# Patient Record
Sex: Male | Born: 1985 | Race: Black or African American | Hispanic: No | Marital: Single | Smoking: Never smoker
Health system: Southern US, Community
[De-identification: ages and names within clinical notes are randomized; demographics above are authoritative.]

---
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
## 2012-06-25 ENCOUNTER — Emergency Department: Payer: Self-pay | Admitting: Emergency Medicine

## 2012-06-25 LAB — COMPREHENSIVE METABOLIC PANEL
Albumin: 4.2 g/dL (ref 3.4–5.0)
Alkaline Phosphatase: 74 U/L (ref 50–136)
BUN: 16 mg/dL (ref 7–18)
Bilirubin,Total: 1.4 mg/dL — ABNORMAL HIGH (ref 0.2–1.0)
Chloride: 102 mmol/L (ref 98–107)
Creatinine: 1.1 mg/dL (ref 0.60–1.30)
EGFR (African American): >60
EGFR (Non-African Amer.): >60
Osmolality: 277 (ref 275–301)
SGOT(AST): 17 U/L (ref 15–37)
SGPT (ALT): 19 U/L (ref 12–78)
Sodium: 138 mmol/L (ref 136–145)

## 2012-06-25 LAB — URINALYSIS, COMPLETE
Bilirubin,UR: NEGATIVE
Glucose,UR: NEGATIVE mg/dL (ref 0–75)
Ketone: NEGATIVE
Leukocyte Esterase: NEGATIVE
Protein: NEGATIVE
Specific Gravity: 1.025 (ref 1.003–1.030)
Squamous Epithelial: NONE SEEN

## 2012-06-25 LAB — CBC
HCT: 42.4 % (ref 40.0–52.0)
MCH: 31.5 pg (ref 26.0–34.0)
MCHC: 34.9 g/dL (ref 32.0–36.0)
MCV: 90 fL (ref 80–100)
Platelet: 221 10*3/uL (ref 150–440)
RBC: 4.7 10*6/uL (ref 4.40–5.90)
WBC: 6.2 10*3/uL (ref 3.8–10.6)

## 2012-06-25 LAB — LIPASE, BLOOD: Lipase: 143 U/L (ref 73–393)

## 2013-09-27 ENCOUNTER — Emergency Department: Payer: Self-pay | Admitting: Emergency Medicine

## 2013-09-27 LAB — URINALYSIS, COMPLETE
BACTERIA: NONE SEEN
Bilirubin,UR: NEGATIVE
Blood: NEGATIVE
Glucose,UR: NEGATIVE mg/dL (ref 0–75)
KETONE: NEGATIVE
Leukocyte Esterase: NEGATIVE
NITRITE: NEGATIVE
PROTEIN: NEGATIVE
Ph: 7 (ref 4.5–8.0)
SPECIFIC GRAVITY: 1.018 (ref 1.003–1.030)
Squamous Epithelial: NONE SEEN
WBC UR: <1 /HPF (ref 0–5)

## 2013-09-27 LAB — CBC WITH DIFFERENTIAL/PLATELET
BASOS PCT: 0.5 %
Basophil #: 0 10*3/uL (ref 0.0–0.1)
EOS ABS: 0.1 10*3/uL (ref 0.0–0.7)
EOS PCT: 1.3 %
HCT: 43.6 % (ref 40.0–52.0)
HGB: 14.4 g/dL (ref 13.0–18.0)
Lymphocyte #: 2.3 10*3/uL (ref 1.0–3.6)
Lymphocyte %: 29 %
MCH: 30.7 pg (ref 26.0–34.0)
MCHC: 33.1 g/dL (ref 32.0–36.0)
MCV: 93 fL (ref 80–100)
MONO ABS: 0.6 x10 3/mm (ref 0.2–1.0)
Monocyte %: 8.3 %
NEUTROS PCT: 60.9 %
Neutrophil #: 4.8 10*3/uL (ref 1.4–6.5)
PLATELETS: 241 10*3/uL (ref 150–440)
RBC: 4.69 10*6/uL (ref 4.40–5.90)
RDW: 11.9 % (ref 11.5–14.5)
WBC: 7.9 10*3/uL (ref 3.8–10.6)

## 2013-09-27 LAB — COMPREHENSIVE METABOLIC PANEL
ALBUMIN: 4.1 g/dL (ref 3.4–5.0)
ALK PHOS: 64 U/L
ANION GAP: 3 — AB (ref 7–16)
BILIRUBIN TOTAL: 1.7 mg/dL — AB (ref 0.2–1.0)
BUN: 9 mg/dL (ref 7–18)
CHLORIDE: 102 mmol/L (ref 98–107)
CO2: 34 mmol/L — AB (ref 21–32)
Calcium, Total: 9 mg/dL (ref 8.5–10.1)
Creatinine: 0.87 mg/dL (ref 0.60–1.30)
EGFR (Non-African Amer.): >60
Glucose: 98 mg/dL (ref 65–99)
Osmolality: 276 (ref 275–301)
POTASSIUM: 4.2 mmol/L (ref 3.5–5.1)
SGOT(AST): 26 U/L (ref 15–37)
SGPT (ALT): 20 U/L
SODIUM: 139 mmol/L (ref 136–145)
TOTAL PROTEIN: 7.9 g/dL (ref 6.4–8.2)

## 2013-09-27 LAB — LIPASE, BLOOD: Lipase: 83 U/L (ref 73–393)

## 2017-10-02 ENCOUNTER — Emergency Department
Admission: EM | Admit: 2017-10-02 | Discharge: 2017-10-02 | Disposition: A | Discharge status: Home / Self Care | Payer: Commercial Managed Care - PPO | Attending: Emergency Medicine | Admitting: Emergency Medicine

## 2017-10-02 DIAGNOSIS — F12980 Cannabis use, unspecified with anxiety disorder: Secondary | ICD-10-CM | POA: Diagnosis not present

## 2017-10-02 DIAGNOSIS — R42 Dizziness and giddiness: Secondary | ICD-10-CM | POA: Diagnosis not present

## 2017-10-02 DIAGNOSIS — F419 Anxiety disorder, unspecified: Secondary | ICD-10-CM | POA: Insufficient documentation

## 2017-10-02 DIAGNOSIS — F12929 Cannabis use, unspecified with intoxication, unspecified: Secondary | ICD-10-CM | POA: Insufficient documentation

## 2017-10-02 DIAGNOSIS — R002 Palpitations: Secondary | ICD-10-CM | POA: Diagnosis present

## 2017-10-02 DIAGNOSIS — R61 Generalized hyperhidrosis: Secondary | ICD-10-CM | POA: Diagnosis not present

## 2017-10-02 MED ORDER — LORAZEPAM 2 MG/ML IJ SOLN
1.0000 mg | Freq: Once | INTRAMUSCULAR | Status: AC
  Administered 2017-10-02: 1 mg via INTRAMUSCULAR
  Filled 2017-10-02: qty 1

## 2017-10-02 NOTE — ED Provider Notes (Signed)
[facility] Emergency Department Provider Note  Time seen: 5:06 AM  I have reviewed the triage vital signs and the nursing notes.   HISTORY  Chief Complaint Ingestion thc    HPI Phillip Johnston is a 32 y.o. male with no past medical history presents to the emergency department after he eating a marijuana brownie.  According to the patient he had a marijuana brownie earlier tonight, states proximally 1 to 2 hours after ingesting he began feeling his heart racing feeling very anxious, lightheaded and sweaty.  Patient states he has had this feeling before after using marijuana but states it went away fairly quickly however this time it is not going away so the patient became very concerned and came to the emergency department.  [name] also ingested a brownie is also a patient in the emergency department with similar symptoms.   No past medical history on file.  There are no active problems to display for this patient.   Prior to Admission medications   Not on File    Allergies not on file  No family history on file.  Social History Social History   Tobacco Use  . Smoking status: Not on file  Substance Use Topics  . Alcohol use: Not on file  . Drug use: Not on file    Review of Systems Constitutional: Negative for fever.  Positive for lightheadedness/dizziness Cardiovascular: Negative for chest pain.  Positive for palpitations, feels like his heart is racing. Respiratory: Negative for shortness of breath. Gastrointestinal: Negative for abdominal pain.  Positive for nausea.  Negative for vomiting Genitourinary: Negative for urinary compaints Musculoskeletal: Negative for musculoskeletal complaints Skin: Negative for skin complaints  Neurological: Negative for headache All other ROS negative  ____________________________________________   PHYSICAL EXAM:  Constitutional: Alert and oriented. Well appearing and in no distress. Eyes:  Normal exam ENT   Head: Normocephalic and atraumatic   Mouth/Throat: Mucous membranes are moist. Cardiovascular: Normal rate, regular rhythm around 100 bpm.  No obvious murmur. Respiratory: Normal respiratory effort without tachypnea nor retractions. Breath sounds are clear  Gastrointestinal: Soft and nontender. No distention.  Musculoskeletal: Nontender with normal range of motion in all extremities.  Neurologic:  Normal speech and language. No gross focal neurologic deficits  Skin:  Skin is warm, dry and intact.  Psychiatric: Mood and affect are normal.   ____________________________________________  INITIAL IMPRESSION / ASSESSMENT AND PLAN / ED COURSE  Pertinent labs & imaging results that were available during my care of the patient were reviewed by me and considered in my medical decision making (see chart for details).  Patient presents to the emergency department with symptoms consistent with anxiety after ingesting cannabinoid.  Differential would include anxiety attack, panic attack, cannabinoid induced anxiety.  Overall the patient appears well, no distress he does have tachycardia around 100 to 110 bpm, is mildly anxious appearing.  We will dose 1 mg of IM Ativan and continue to closely monitor.  Patient agreeable to plan of care.  Patient feeling somewhat better.  Feeling more tired at this point.  We will continue to monitor in the emergency department.  Patient care signed out to Dr. Darnelle Johnston.  ____________________________________________   FINAL CLINICAL IMPRESSION(S) / ED DIAGNOSES  Panic disorder Marijuana ingestion   Minna Antis, MD 10/02/17 [phone]

## 2017-10-02 NOTE — ED Provider Notes (Signed)
Patient is awake alert and oriented he is ready to go at this time.  His wife is also doing well and will be discharged.   Arnaldo Natal, MD 10/02/17 [phone]

## 2017-10-02 NOTE — ED Triage Notes (Signed)
Pt here after eating a pot browne with his wife this a.m.  Feels his hart racing

## 2017-10-02 NOTE — Discharge Instructions (Addendum)
Please be careful to marijuana that is going around nowadays is much more potent than the marijuana from 10 or 15 years ago.  There are many more side effects with it as well.

## 2017-10-26 ENCOUNTER — Other Ambulatory Visit: Payer: Self-pay

## 2017-10-26 ENCOUNTER — Emergency Department: Payer: Commercial Managed Care - PPO

## 2017-10-26 ENCOUNTER — Encounter: Payer: Self-pay | Admitting: *Deleted

## 2017-10-26 ENCOUNTER — Emergency Department
Admission: EM | Admit: 2017-10-26 | Discharge: 2017-10-26 | Disposition: A | Discharge status: Home / Self Care | Payer: Commercial Managed Care - PPO | Attending: Emergency Medicine | Admitting: Emergency Medicine

## 2017-10-26 DIAGNOSIS — R079 Chest pain, unspecified: Secondary | ICD-10-CM | POA: Insufficient documentation

## 2017-10-26 DIAGNOSIS — N50811 Right testicular pain: Secondary | ICD-10-CM | POA: Diagnosis present

## 2017-10-26 DIAGNOSIS — N451 Epididymitis: Secondary | ICD-10-CM | POA: Diagnosis not present

## 2017-10-26 DIAGNOSIS — R0789 Other chest pain: Secondary | ICD-10-CM

## 2017-10-26 LAB — CBC
HCT: 40.7 % (ref 39.0–52.0)
Hemoglobin: 13.6 g/dL (ref 13.0–17.0)
MCH: 30.6 pg (ref 26.0–34.0)
MCHC: 33.4 g/dL (ref 30.0–36.0)
MCV: 91.7 fL (ref 80.0–100.0)
NRBC: 0 % (ref 0.0–0.2)
PLATELETS: 314 10*3/uL (ref 150–400)
RBC: 4.44 MIL/uL (ref 4.22–5.81)
RDW: 11.1 % — ABNORMAL LOW (ref 11.5–15.5)
WBC: 16.2 10*3/uL — AB (ref 4.0–10.5)

## 2017-10-26 LAB — TROPONIN I

## 2017-10-26 LAB — BASIC METABOLIC PANEL
ANION GAP: 9 (ref 5–15)
BUN: 17 mg/dL (ref 6–20)
CALCIUM: 9.6 mg/dL (ref 8.9–10.3)
CO2: 28 mmol/L (ref 22–32)
CREATININE: 0.87 mg/dL (ref 0.61–1.24)
Chloride: 99 mmol/L (ref 98–111)
Glucose, Bld: 101 mg/dL — ABNORMAL HIGH (ref 70–99)
Potassium: 3.7 mmol/L (ref 3.5–5.1)
SODIUM: 136 mmol/L (ref 135–145)

## 2017-10-26 LAB — CHLAMYDIA/NGC RT PCR (ARMC ONLY)
CHLAMYDIA TR: NOT DETECTED
N GONORRHOEAE: NOT DETECTED

## 2017-10-26 MED ORDER — DOXYCYCLINE HYCLATE 100 MG PO TABS
100.0000 mg | ORAL_TABLET | Freq: Once | ORAL | Status: AC
  Administered 2017-10-26: 100 mg via ORAL
  Filled 2017-10-26: qty 1

## 2017-10-26 MED ORDER — KETOROLAC TROMETHAMINE 60 MG/2ML IM SOLN
60.0000 mg | Freq: Once | INTRAMUSCULAR | Status: AC
  Administered 2017-10-26: 60 mg via INTRAMUSCULAR
  Filled 2017-10-26: qty 2

## 2017-10-26 MED ORDER — TRAMADOL HCL 50 MG PO TABS: 50.0000 mg | ORAL_TABLET | Freq: Four times a day (QID) | ORAL | 0 refills | Status: DC | PRN

## 2017-10-26 MED ORDER — CEFTRIAXONE SODIUM 250 MG IJ SOLR
250.0000 mg | Freq: Once | INTRAMUSCULAR | Status: AC
  Administered 2017-10-26: 250 mg via INTRAMUSCULAR
  Filled 2017-10-26: qty 250

## 2017-10-26 MED ORDER — DOXYCYCLINE HYCLATE 100 MG PO TABS: 100.0000 mg | ORAL_TABLET | Freq: Two times a day (BID) | ORAL | 0 refills | Status: DC

## 2017-10-26 NOTE — ED Notes (Addendum)
Pt to the er for chest pain intermittent x 3 weeks and a knot on one of his testicles x 3 days. Pt then states the knot is above the testicle. 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 No acute distress at this time. Pt took motrin at 1800.

## 2017-10-26 NOTE — ED Notes (Signed)
Pt ambulatory to POV without difficulty. VSS. NAD. Discharge instructions, RX and follow up reviewed. All questions and concerns addressed.  

## 2017-10-26 NOTE — ED Provider Notes (Addendum)
[facility] Emergency Department Provider Note  Time seen: 9:54 PM  I have reviewed the triage vital signs and the nursing notes.   HISTORY  Chief Complaint Chest Pain    HPI Phillip Johnston is a 32 y.o. male with no significant past medical history presents to the emergency department with complaints of chest pain and right testicular pain.  According to the patient he has been experiencing intermittent chest pain over the past 3 to 4 weeks.  States it comes and goes occasionally with shortness of breath although denies any chest pain or shortness of breath currently.  Denies any nausea or diaphoresis at any point.  No leg pain or swelling.  Patient states the main reason he came to the emergency department today however is due to right testicular pain.  States over the past 3 days he has been expensing intermittent right testicular pain which she states has progressively worsened and then today since around 12 PM it has been more severe.  Currently rates his pain as a 6/10 in severity to the right testicle, much worse with walking or palpating the testicle.  Denies any penile discharge.  He is sexually active but does not believe he has a sexually transmitted disease.  Denies any dysuria.   History reviewed. No pertinent past medical history.  There are no active problems to display for this patient.   History reviewed. No pertinent surgical history.  Prior to Admission medications   Not on File    No Known Allergies  History reviewed. No pertinent family history.  Social History Social History   Tobacco Use  . Smoking status: Never Smoker  . Smokeless tobacco: Never Used  Substance Use Topics  . Alcohol use: Yes    Frequency: Never    Comment: weekends  . Drug use: Never    Review of Systems Constitutional: Negative for fever. Cardiovascular: Intermittent chest pain x1 month.  None currently. Respiratory: Negative for shortness of  breath. Gastrointestinal: Negative for abdominal pain Genitourinary: Negative for dysuria or hematuria.  Negative for penile discharge.  Positive for right testicular pain swelling. Musculoskeletal: Negative for musculoskeletal complaints Skin: Negative for skin complaints  Neurological: Negative for headache All other ROS negative  ____________________________________________   PHYSICAL EXAM:  VITAL SIGNS: ED Triage Vitals  Enc Vitals Group     BP 10/26/17 1950 123/70     Pulse Rate 10/26/17 1950 90     Resp 10/26/17 1950 16     Temp 10/26/17 1950 98 F (36.7 C)     Temp Source 10/26/17 1950 Oral     SpO2 10/26/17 1950 98 %     Weight 10/26/17 1951 141 lb (64 kg)     Height 10/26/17 1951 6\' 1"  (1.854 m)     Head Circumference --      Peak Flow --      Pain Score 10/26/17 1951 7     Pain Loc --      Pain Edu? --      Excl. in GC? --     Constitutional: Alert and oriented. Well appearing and in no distress. Eyes: Normal exam ENT   Head: Normocephalic and atraumatic.   Mouth/Throat: Mucous membranes are moist. Cardiovascular: Normal rate, regular rhythm. No murmur Respiratory: Normal respiratory effort without tachypnea nor retractions. Breath sounds are clear  Gastrointestinal: Soft and nontender. No distention.   Genitourinary: Normal penile exam.  No lesions noted.  Patient does have right testicular swelling with significant  tenderness to palpation especially over his epididymis.  Normal left testicle.  No signs of hernia or mass. Musculoskeletal: Nontender with normal range of motion in all extremities. No lower extremity tenderness or edema. Neurologic:  Normal speech and language. No gross focal neurologic deficits are appreciated. Skin:  Skin is warm, dry and intact.  Psychiatric: Mood and affect are normal.   ____________________________________________    RADIOLOGY  Ultrasound negative for torsion shows likely right-sided  epididymitis.  ____________________________________________   INITIAL IMPRESSION / ASSESSMENT AND PLAN / ED COURSE  Pertinent labs & imaging results that were available during my care of the patient were reviewed by me and considered in my medical decision making (see chart for details).  Patient presents emergency department for chest pain intermittent x1 month however he states the real reason he came today was due to right testicular pain intermittent x3 days although worse today since 12 PM.  Differential would include epididymitis, testicular torsion, varicocele or hydrocele.  As far as the chest pain this would include chest wall pain, ACS, pleurisy.  We will check labs including cardiac enzymes obtain a scrotal ultrasound and continue to closely monitor.  Patient's lab work is largely within normal limits besides a moderate leukocytosis this is likely pain induced given the patient's significant pain today due to right testicular pain.  Right testicle is tender especially over the epididymis highly suspect epididymitis.  We will send for an ultrasound to rule out testicular torsion and help evaluate for epididymitis.  We will treat with Rocephin and discharged with doxycycline twice daily for the next 10 days.  We will send a urine sample for GC/chlamydia.  Patient agreeable to plan of care.  EKG reviewed and interpreted by myself shows normal sinus rhythm 80 bpm with a narrow QRS, normal axis, normal intervals, nonspecific ST changes.  ____________________________________________   FINAL CLINICAL IMPRESSION(S) / ED DIAGNOSES  Epididymitis    Minna Antis, MD 10/26/17 2158    Minna Antis, MD 11/07/17 [phone]

## 2017-10-26 NOTE — ED Triage Notes (Signed)
Pt reporting centralized chest pain x 3 days. No radiation. No cough or congestion reported. Nothing changes the pain per pt. No fevers and no respiratory distress.

## 2020-02-28 IMAGING — CR DG CHEST 2V
2 series · 2 of 2 positions shown · non-contrast
Comparison: None.

CLINICAL DATA: Chest pain

EXAM:
CHEST - 2 VIEW

[chest pa]
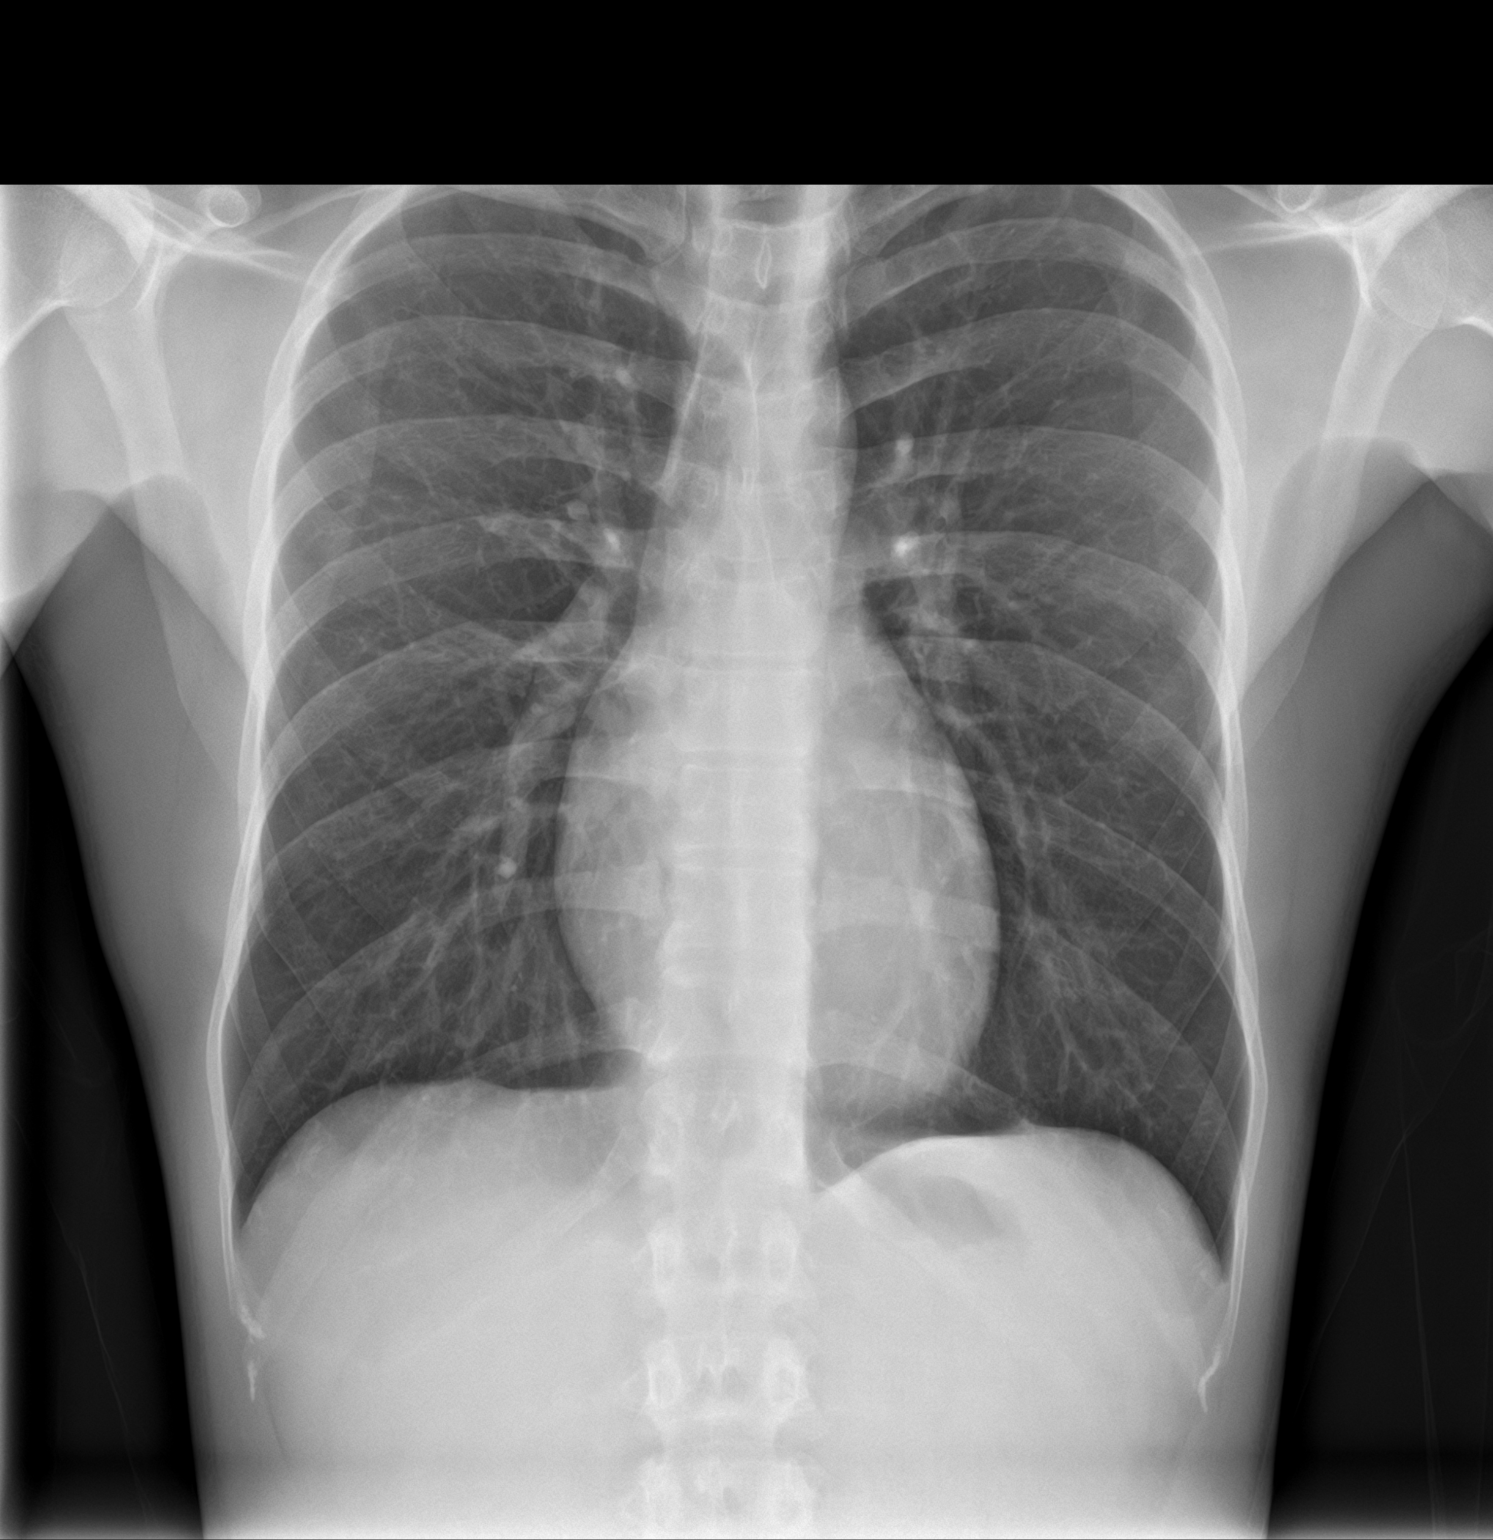

[chest lat]
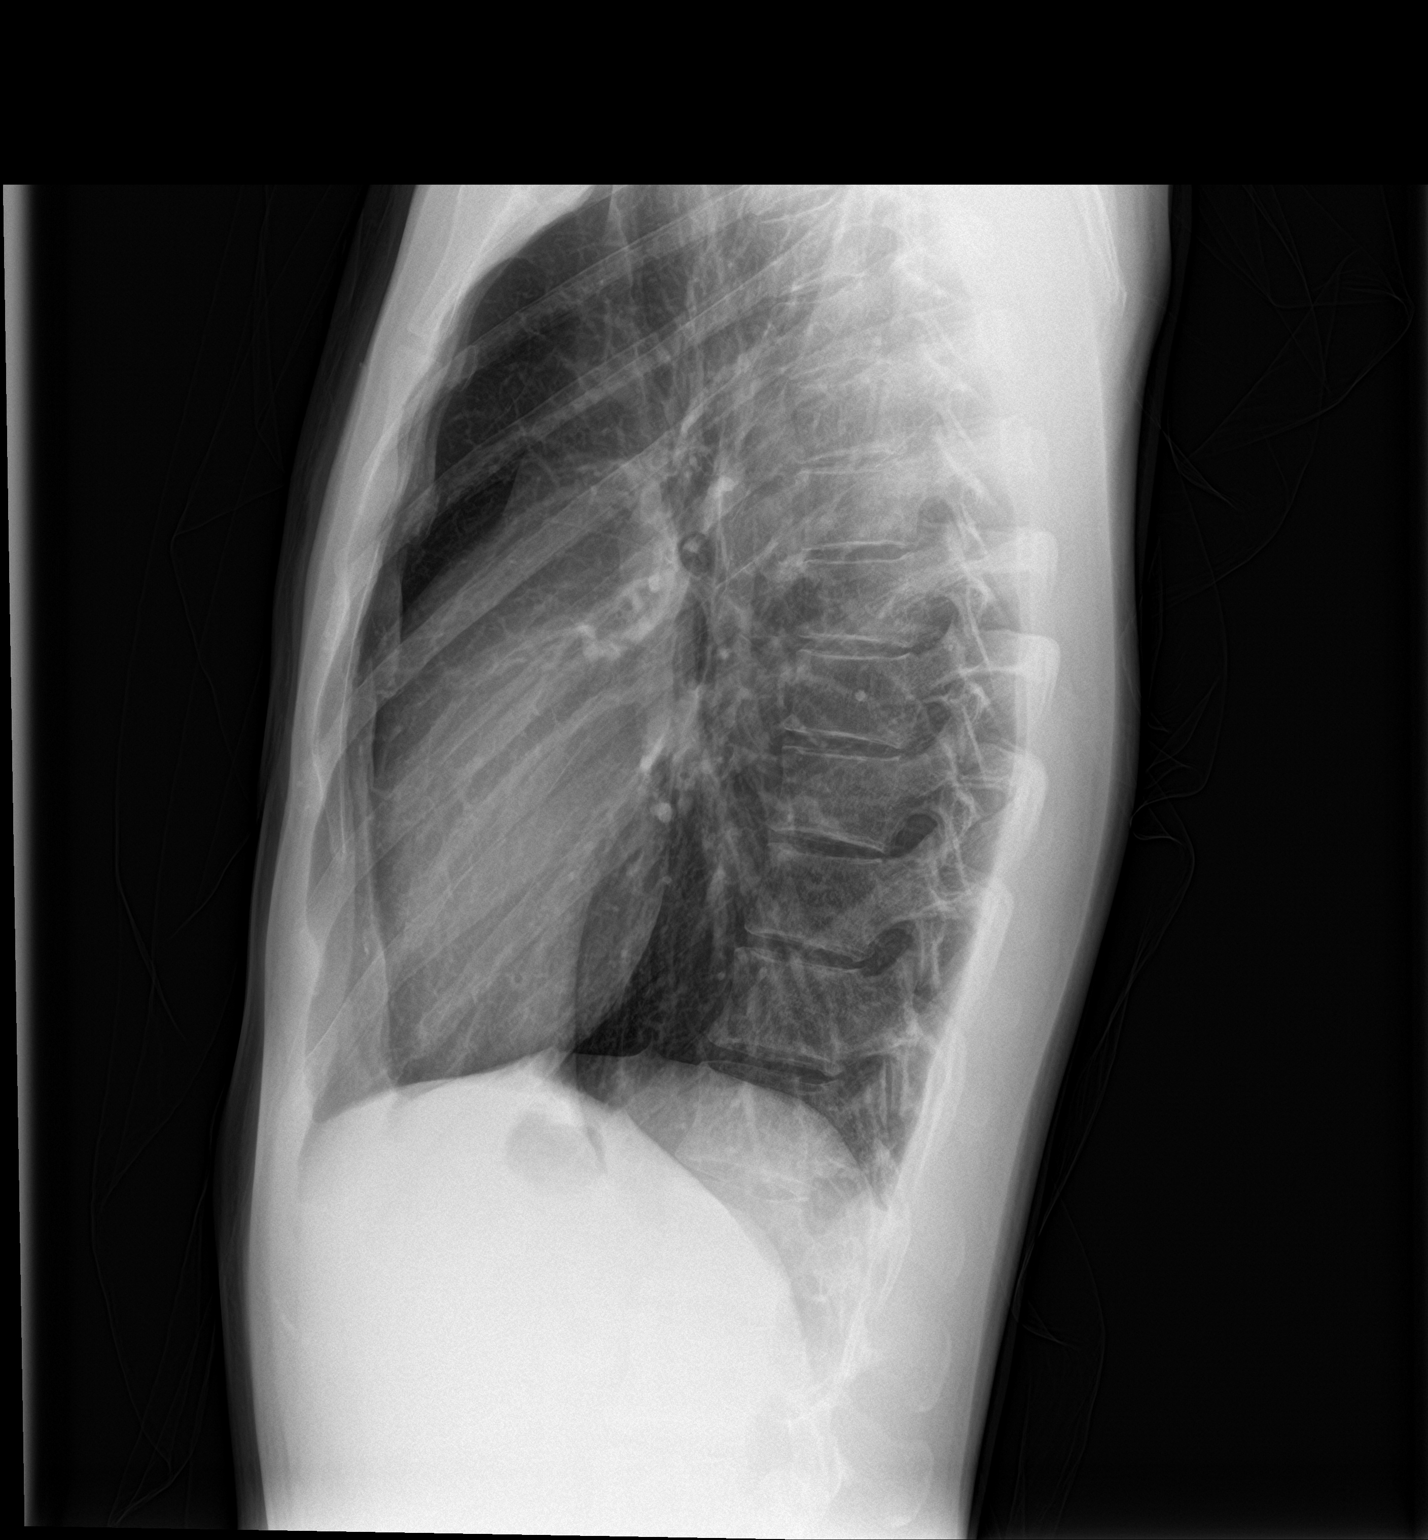

[2 of 2 positions shown; findings below may reference images not displayed]

FINDINGS: The heart size and mediastinal contours are within normal limits.
Both lungs are clear. The visualized skeletal structures are
unremarkable.
IMPRESSION: No active cardiopulmonary disease.

## 2020-11-25 ENCOUNTER — Ambulatory Visit: Payer: Commercial Managed Care - PPO | Admitting: Internal Medicine

## 2020-11-25 ENCOUNTER — Encounter: Payer: Self-pay | Admitting: Internal Medicine

## 2020-11-25 ENCOUNTER — Other Ambulatory Visit: Payer: Self-pay

## 2020-11-25 VITALS — BP 137/79 | HR 75 | Ht 73.0 in | Wt 143.6 lb

## 2020-11-25 DIAGNOSIS — R42 Dizziness and giddiness: Secondary | ICD-10-CM

## 2020-11-25 DIAGNOSIS — K219 Gastro-esophageal reflux disease without esophagitis: Secondary | ICD-10-CM | POA: Diagnosis not present

## 2020-11-25 DIAGNOSIS — Z0000 Encounter for general adult medical examination without abnormal findings: Secondary | ICD-10-CM

## 2020-11-25 MED ORDER — OMEPRAZOLE 20 MG PO CPDR: 20.0000 mg | DELAYED_RELEASE_CAPSULE | Freq: Every day | ORAL | 3 refills | Status: DC

## 2020-11-25 NOTE — Assessment & Plan Note (Signed)
Avoid coffee tea Sprite and Cokes

## 2020-11-25 NOTE — Progress Notes (Signed)
Established Patient Office Visit  Subjective:  Patient ID: Phillip Johnston, male    DOB: [birth year]  Age: 35 y.o. MRN: [id number]  CC:  Chief Complaint  Patient presents with   New Patient (Initial Visit)   Gastroesophageal Reflux    Patient having a lot of issues after eating. Feels pain in upper chest that goes into his throat after eating. Especially with acidic foods. Has feelings of bloating and feeling of needing to belch. He denies nausea but state that his stomach feels like it turns over like after riding roller coaster.     Gastroesophageal Reflux He complains of belching, chest pain, heartburn, a hoarse voice and water brash. He reports no coughing, no dysphagia, no nausea or no wheezing. The current episode started more than 1 month ago. The heartburn does not wake him from sleep. The heartburn does not limit his activity. Pertinent negatives include no anemia, fatigue, melena or orthopnea. He has tried an antacid for the symptoms.   Phillip Johnston presents for new patient visit  History reviewed. No pertinent past medical history.  History reviewed. No pertinent surgical history.  Family History  Problem Relation Age of Onset   Diabetes Father    Drug abuse Father    Alcohol abuse Father    Cancer Brother     Social History   Socioeconomic History   Marital status: Single    Spouse name: Not on file   Number of children: 0   Years of education: Not on file   Highest education level: 12th grade  Occupational History    Employer: [employer]  Tobacco Use   Smoking status: Never   Smokeless tobacco: Never  Substance and Sexual Activity   Alcohol use: Yes    Alcohol/week: 3.0 standard drinks    Types: 3 Standard drinks or equivalent per week    Comment: weekends   Drug use: Never   Sexual activity: Yes  Other Topics Concern   Not on file  Social History Narrative   Not on file   Social Determinants of Health   Financial Resource Strain: Not on file   Food Insecurity: Not on file  Transportation Needs: Not on file  Physical Activity: Not on file  Stress: Not on file  Social Connections: Not on file  Intimate Partner Violence: Not on file     Current Outpatient Medications:    doxycycline (VIBRA-TABS) 100 MG tablet, Take 1 tablet (100 mg total) by mouth 2 (two) times daily., Disp: 20 tablet, Rfl: 0   traMADol (ULTRAM) 50 MG tablet, Take 1 tablet (50 mg total) by mouth every 6 (six) hours as needed., Disp: 10 tablet, Rfl: 0   No Known Allergies  ROS Review of Systems  Constitutional: Negative.  Negative for fatigue.  HENT:  Positive for hoarse voice.   Eyes: Negative.   Respiratory:  Negative for cough and wheezing.   Cardiovascular:  Positive for chest pain.  Gastrointestinal:  Positive for heartburn. Negative for dysphagia, melena and nausea.  Endocrine: Negative.   Genitourinary: Negative.   Musculoskeletal: Negative.   Skin: Negative.   Allergic/Immunologic: Negative.   Neurological: Negative.   Hematological: Negative.   Psychiatric/Behavioral: Negative.    All other systems reviewed and are negative.    Objective:    Physical Exam Vitals reviewed.  Constitutional:      Appearance: Normal appearance.  HENT:     Mouth/Throat:     Mouth: Mucous membranes are moist.  Eyes:  Pupils: Pupils are equal, round, and reactive to light.  Neck:     Vascular: No carotid bruit.  Cardiovascular:     Rate and Rhythm: Normal rate and regular rhythm.     Pulses: Normal pulses.     Heart sounds: Normal heart sounds.  Pulmonary:     Effort: Pulmonary effort is normal.     Breath sounds: Normal breath sounds.  Abdominal:     General: Bowel sounds are normal.     Palpations: Abdomen is soft. There is no hepatomegaly, splenomegaly or mass.     Tenderness: There is no abdominal tenderness.     Hernia: No hernia is present.  Musculoskeletal:     Cervical back: Neck supple.     Right lower leg: No edema.     Left lower  leg: No edema.  Skin:    Findings: No rash.  Neurological:     Mental Status: He is alert and oriented to person, place, and time.     Motor: No weakness.  Psychiatric:        Mood and Affect: Mood normal.        Behavior: Behavior normal.    BP 137/79   Pulse 75   Ht 6\' 1"  (1.854 m)   Wt 143 lb 9.6 oz (65.1 kg)   BMI 18.95 kg/m  Wt Readings from Last 3 Encounters:  11/25/20 143 lb 9.6 oz (65.1 kg)  10/26/17 141 lb (64 kg)  10/02/17 139 lb (63 kg)     Health Maintenance Due  Topic Date Due   HIV Screening  Never done   Hepatitis C Screening  Never done    There are no preventive care reminders to display for this patient.  No results found for: TSH Lab Results  Component Value Date   WBC 16.2 (H) 10/26/2017   HGB 13.6 10/26/2017   HCT 40.7 10/26/2017   MCV 91.7 10/26/2017   PLT 314 10/26/2017   Lab Results  Component Value Date   NA 136 10/26/2017   K 3.7 10/26/2017   CO2 28 10/26/2017   GLUCOSE 101 (H) 10/26/2017   BUN 17 10/26/2017   CREATININE 0.87 10/26/2017   BILITOT 1.7 (H) 09/27/2013   ALKPHOS 64 09/27/2013   AST 26 09/27/2013   ALT 20 09/27/2013   PROT 7.9 09/27/2013   ALBUMIN 4.1 09/27/2013   CALCIUM 9.6 10/26/2017   ANIONGAP 9 10/26/2017   No results found for: CHOL No results found for: HDL No results found for: LDLCALC No results found for: TRIG No results found for: CHOLHDL No results found for: 10/28/2017    Assessment & Plan:   Problem List Items Addressed This Visit       Digestive   Gastroesophageal reflux disease without esophagitis    - The patient's GERD is stable on medication.  - Instructed the patient to avoid eating spicy and acidic foods, as well as foods high in fat. - Instructed the patient to avoid eating large meals or meals 2-3 hours prior to sleeping.        Other   Dizziness    Stable at the present time      Preventative health care - Primary    Avoid coffee tea Sprite and Cokes       No orders of  the defined types were placed in this encounter.   Follow-up: No follow-ups on file.    [id number], MD

## 2020-11-25 NOTE — Assessment & Plan Note (Signed)
Stable at the present time. 

## 2020-11-25 NOTE — Assessment & Plan Note (Signed)
-   The patient's GERD is stable on medication.  - Instructed the patient to avoid eating spicy and acidic foods, as well as foods high in fat. - Instructed the patient to avoid eating large meals or meals 2-3 hours prior to sleeping. 

## 2020-12-19 ENCOUNTER — Ambulatory Visit: Payer: Commercial Managed Care - PPO | Admitting: Internal Medicine

## 2023-02-21 ENCOUNTER — Ambulatory Visit
Admission: EM | Admit: 2023-02-21 | Discharge: 2023-02-21 | Disposition: A | Discharge status: Home / Self Care | Payer: Commercial Managed Care - PPO | Attending: Emergency Medicine | Admitting: Emergency Medicine

## 2023-02-21 ENCOUNTER — Encounter: Payer: Self-pay | Admitting: Emergency Medicine

## 2023-02-21 DIAGNOSIS — J09X2 Influenza due to identified novel influenza A virus with other respiratory manifestations: Secondary | ICD-10-CM | POA: Diagnosis not present

## 2023-02-21 LAB — RESP PANEL BY RT-PCR (FLU A&B, COVID) ARPGX2
Influenza A by PCR: POSITIVE — AB
Influenza B by PCR: NEGATIVE
SARS Coronavirus 2 by RT PCR: NEGATIVE

## 2023-02-21 MED ORDER — BENZONATATE 100 MG PO CAPS: 200.0000 mg | ORAL_CAPSULE | Freq: Three times a day (TID) | ORAL | 0 refills | Status: AC

## 2023-02-21 MED ORDER — OSELTAMIVIR PHOSPHATE 75 MG PO CAPS: 75.0000 mg | ORAL_CAPSULE | Freq: Two times a day (BID) | ORAL | 0 refills | Status: AC

## 2023-02-21 MED ORDER — PROMETHAZINE-DM 6.25-15 MG/5ML PO SYRP: 5.0000 mL | ORAL_SOLUTION | Freq: Four times a day (QID) | ORAL | 0 refills | Status: AC | PRN

## 2023-02-21 MED ORDER — IPRATROPIUM BROMIDE 0.06 % NA SOLN: 2.0000 | Freq: Four times a day (QID) | NASAL | 12 refills | Status: AC

## 2023-02-21 NOTE — Discharge Instructions (Signed)
 Take the Tamiflu twice daily for 5 days for treatment of influenza.  Use over-the-counter Tylenol and/or ibuprofen according to pack instructions as needed for any fever or pain.  Use the Atrovent nasal spray, 2 squirts up each nostril every 6 hours, as needed for nasal congestion and runny nose.  Use over-the-counter Delsym, Zarbee's, or Robitussin during the day as needed for cough.  Use the Tessalon Perles every 8 hours as needed for cough.  Taken with a small sip of water.  You may experience some numbness to your tongue or metallic taste in your mouth, this is normal.  Use the Promethazine DM cough syrup at bedtime as will make you drowsy but it should help dry up your postnasal drip and aid you in sleep and cough relief.  Return for reevaluation, or see your primary care provider, for new or worsening symptoms.

## 2023-02-21 NOTE — ED Triage Notes (Signed)
Patient present with c/o nasal congestion, non productive cough, chills and generalized body aches x 1 day. Patient states he is taking mucinex and cold.flu medication for the symptoms.

## 2023-02-21 NOTE — ED Provider Notes (Signed)
[facility]    CSN: [id number] Arrival date & time: 02/21/23  4098      History   Chief Complaint Chief Complaint  Patient presents with   Cough   Generalized Body Aches   Nasal Congestion   Chills    HPI Phillip Johnston is a 38 y.o. male.   HPI  38 year old male with a past medical history significant for GERD presents for evaluation of respiratory symptoms that started yesterday which include fever with a Tmax of 101, nasal congestion with clear nasal discharge, chills, body aches, and a nonproductive cough.  2 weeks ago he traveled to [place].  He is unaware of any known sick contacts.  He denies ear pain, sore throat, shortness of breath, wheezing, or GI complaints.  History reviewed. No pertinent past medical history.  Patient Active Problem List   Diagnosis Date Noted   Dizziness 11/25/2020   Gastroesophageal reflux disease without esophagitis 11/25/2020   Preventative health care 11/25/2020    History reviewed. No pertinent surgical history.     Home Medications    Prior to Admission medications   Medication Sig Start Date End Date Taking? Authorizing Provider  benzonatate (TESSALON) 100 MG capsule Take 2 capsules (200 mg total) by mouth every 8 (eight) hours. 02/21/23  Yes Becky Augusta, NP  ipratropium (ATROVENT) 0.06 % nasal spray Place 2 sprays into both nostrils 4 (four) times daily. 02/21/23  Yes Becky Augusta, NP  oseltamivir (TAMIFLU) 75 MG capsule Take 1 capsule (75 mg total) by mouth every 12 (twelve) hours. 02/21/23  Yes Becky Augusta, NP  promethazine-dextromethorphan (PROMETHAZINE-DM) 6.25-15 MG/5ML syrup Take 5 mLs by mouth 4 (four) times daily as needed. 02/21/23  Yes Becky Augusta, NP    Family History Family History  Problem Relation Age of Onset   Diabetes Father    Drug abuse Father    Alcohol abuse Father    Cancer Brother     Social History Social History   Tobacco Use   Smoking status: Never   Smokeless tobacco: Never   Vaping Use   Vaping status: Some Days   Substances: Flavoring  Substance Use Topics   Alcohol use: Yes    Alcohol/week: 3.0 standard drinks of alcohol    Types: 3 Standard drinks or equivalent per week    Comment: weekends   Drug use: Never     Allergies   Patient has no known allergies.   Review of Systems Review of Systems  Constitutional:  Positive for fever.  HENT:  Positive for congestion and rhinorrhea. Negative for ear pain and sore throat.   Respiratory:  Positive for cough. Negative for shortness of breath and wheezing.   Gastrointestinal:  Negative for diarrhea, nausea and vomiting.  Musculoskeletal:  Positive for arthralgias and myalgias.  Skin:  Negative for rash.  Neurological:  Negative for headaches.     Physical Exam Triage Vital Signs ED Triage Vitals  Encounter Vitals Group     BP      Systolic BP Percentile      Diastolic BP Percentile      Pulse      Resp      Temp      Temp src      SpO2      Weight      Height      Head Circumference      Peak Flow      Pain Score      Pain Loc  Pain Education      Exclude from Growth Chart    No data found.  Updated Vital Signs BP 105/62 (BP Location: Left Arm)   Pulse 68   Temp 98.2 F (36.8 C) (Oral)   Resp 18   SpO2 96%   Visual Acuity Right Eye Distance:   Left Eye Distance:   Bilateral Distance:    Right Eye Near:   Left Eye Near:    Bilateral Near:     Physical Exam Vitals and nursing note reviewed.  Constitutional:      Appearance: Normal appearance. He is not ill-appearing.  HENT:     Head: Normocephalic and atraumatic.     Right Ear: Tympanic membrane, ear canal and external ear normal. There is no impacted cerumen.     Left Ear: Tympanic membrane, ear canal and external ear normal. There is no impacted cerumen.     Nose: Congestion and rhinorrhea present.     Comments: Nasal mucosa is mildly edematous and erythematous with scant clear nasal discharge in both nares.     Mouth/Throat:     Mouth: Mucous membranes are moist.     Pharynx: Oropharynx is clear. No oropharyngeal exudate or posterior oropharyngeal erythema.  Cardiovascular:     Rate and Rhythm: Normal rate and regular rhythm.     Pulses: Normal pulses.     Heart sounds: Normal heart sounds. No murmur heard.    No friction rub. No gallop.  Pulmonary:     Effort: Pulmonary effort is normal.     Breath sounds: Normal breath sounds. No wheezing, rhonchi or rales.  Musculoskeletal:     Cervical back: Normal range of motion and neck supple. No tenderness.  Lymphadenopathy:     Cervical: No cervical adenopathy.  Skin:    General: Skin is warm and dry.     Capillary Refill: Capillary refill takes less than 2 seconds.     Findings: No rash.  Neurological:     General: No focal deficit present.     Mental Status: He is alert and oriented to person, place, and time.      UC Treatments / Results  Labs (all labs ordered are listed, but only abnormal results are displayed) Labs Reviewed  RESP PANEL BY RT-PCR (FLU A&B, COVID) ARPGX2 - Abnormal; Notable for the following components:      Result Value   Influenza A by PCR POSITIVE (*)    All other components within normal limits    EKG   Radiology No results found.  Procedures Procedures (including critical care time)  Medications Ordered in UC Medications - No data to display  Initial Impression / Assessment and Plan / UC Course  I have reviewed the triage vital signs and the nursing notes.  Pertinent labs & imaging results that were available during my care of the patient were reviewed by me and considered in my medical decision making (see chart for details).   Patient is a pleasant, nontoxic-appearing 38 year old male presenting for evaluation of respiratory symptoms as outlined HPI above.  In the exam room he is not in any acute respiratory distress and he is not exhibiting any cough.  He is able to speak in full sentences without  dyspnea or tachypnea.  Respiratory rate at triage was 18 with a 96% room air oxygen saturation.  Oral temp is afebrile at 98.2.  His physical exam does reveal mild inflammation of his upper respiratory tract without significant rhinorrhea.  Oropharyngeal and cardiopulmonary exams  are benign.  Differential diagnosis include COVID, influenza, viral respiratory illness.  I will order a COVID and flu PCR.  Respiratory panel is positive for influenza A.  I will discharge patient home with a diagnosis of influenza A and start him on Tamiflu 75 mg twice daily for 5 days.  He may use over-the-counter Tylenol and/or ibuprofen as needed for fever or pain.  Atrovent nasal spray for nasal congestion along with Tessalon Perles and Promethazine DM cough syrup for cough and congestion.  Return precautions reviewed.  Work note provided.   Final Clinical Impressions(s) / UC Diagnoses   Final diagnoses:  Influenza due to identified novel influenza A virus with other respiratory manifestations     Discharge Instructions      Take the Tamiflu twice daily for 5 days for treatment of influenza.  Use over-the-counter Tylenol and/or ibuprofen according to pack instructions as needed for any fever or pain.  Use the Atrovent nasal spray, 2 squirts up each nostril every 6 hours, as needed for nasal congestion and runny nose.  Use over-the-counter Delsym, Zarbee's, or Robitussin during the day as needed for cough.  Use the Tessalon Perles every 8 hours as needed for cough.  Taken with a small sip of water.  You may experience some numbness to your tongue or metallic taste in your mouth, this is normal.  Use the Promethazine DM cough syrup at bedtime as will make you drowsy but it should help dry up your postnasal drip and aid you in sleep and cough relief.  Return for reevaluation, or see your primary care provider, for new or worsening symptoms.      ED Prescriptions     Medication Sig Dispense Auth.  Provider   benzonatate (TESSALON) 100 MG capsule Take 2 capsules (200 mg total) by mouth every 8 (eight) hours. 21 capsule Becky Augusta, NP   ipratropium (ATROVENT) 0.06 % nasal spray Place 2 sprays into both nostrils 4 (four) times daily. 15 mL Becky Augusta, NP   promethazine-dextromethorphan (PROMETHAZINE-DM) 6.25-15 MG/5ML syrup Take 5 mLs by mouth 4 (four) times daily as needed. 118 mL Becky Augusta, NP   oseltamivir (TAMIFLU) 75 MG capsule Take 1 capsule (75 mg total) by mouth every 12 (twelve) hours. 10 capsule Becky Augusta, NP      PDMP not reviewed this encounter.   Becky Augusta, NP 02/21/23 1034
